# Patient Record
Sex: Female | Born: 1950 | Race: White | Hispanic: No | Marital: Married | Smoking: Never smoker
Health system: Southern US, Community
[De-identification: ages and names within clinical notes are randomized; demographics above are authoritative.]

## PROBLEM LIST (undated history)

## (undated) DIAGNOSIS — E079 Disorder of thyroid, unspecified: Secondary | ICD-10-CM

---

## 2003-09-19 ENCOUNTER — Other Ambulatory Visit: Admission: RE | Admit: 2003-09-19 | Discharge: 2003-09-19 | Payer: Self-pay | Admitting: Obstetrics and Gynecology

## 2004-02-24 ENCOUNTER — Ambulatory Visit (HOSPITAL_BASED_OUTPATIENT_CLINIC_OR_DEPARTMENT_OTHER): Admission: RE | Admit: 2004-02-24 | Discharge: 2004-02-24 | Payer: Self-pay | Admitting: Urology

## 2004-11-20 ENCOUNTER — Ambulatory Visit (HOSPITAL_COMMUNITY): Admission: RE | Admit: 2004-11-20 | Discharge: 2004-11-20 | Payer: Self-pay | Admitting: Obstetrics and Gynecology

## 2004-11-28 ENCOUNTER — Encounter: Admission: RE | Admit: 2004-11-28 | Discharge: 2004-11-28 | Payer: Self-pay | Admitting: Obstetrics and Gynecology

## 2004-12-04 ENCOUNTER — Encounter: Admission: RE | Admit: 2004-12-04 | Discharge: 2004-12-04 | Payer: Self-pay | Admitting: Obstetrics and Gynecology

## 2004-12-14 ENCOUNTER — Ambulatory Visit (HOSPITAL_COMMUNITY): Admission: RE | Admit: 2004-12-14 | Discharge: 2004-12-14 | Payer: Self-pay | Admitting: Urology

## 2004-12-14 ENCOUNTER — Ambulatory Visit (HOSPITAL_BASED_OUTPATIENT_CLINIC_OR_DEPARTMENT_OTHER): Admission: RE | Admit: 2004-12-14 | Discharge: 2004-12-14 | Payer: Self-pay | Admitting: Urology

## 2005-01-18 ENCOUNTER — Ambulatory Visit (HOSPITAL_COMMUNITY): Admission: RE | Admit: 2005-01-18 | Discharge: 2005-01-18 | Payer: Self-pay | Admitting: Gastroenterology

## 2006-04-08 ENCOUNTER — Emergency Department (HOSPITAL_COMMUNITY): Admission: EM | Admit: 2006-04-08 | Discharge: 2006-04-08 | Payer: Self-pay | Admitting: Emergency Medicine

## 2010-05-06 ENCOUNTER — Encounter: Payer: Self-pay | Admitting: Obstetrics and Gynecology

## 2017-05-30 ENCOUNTER — Other Ambulatory Visit: Payer: Self-pay

## 2017-05-30 ENCOUNTER — Emergency Department: Payer: Commercial Managed Care - PPO

## 2017-05-30 ENCOUNTER — Encounter: Payer: Self-pay | Admitting: Emergency Medicine

## 2017-05-30 ENCOUNTER — Emergency Department
Admission: EM | Admit: 2017-05-30 | Discharge: 2017-05-30 | Disposition: A | Discharge status: Home / Self Care | Payer: Commercial Managed Care - PPO | Attending: Emergency Medicine | Admitting: Emergency Medicine

## 2017-05-30 DIAGNOSIS — S42435A Nondisplaced fracture (avulsion) of lateral epicondyle of left humerus, initial encounter for closed fracture: Secondary | ICD-10-CM | POA: Diagnosis not present

## 2017-05-30 DIAGNOSIS — Y999 Unspecified external cause status: Secondary | ICD-10-CM | POA: Insufficient documentation

## 2017-05-30 DIAGNOSIS — Y939 Activity, unspecified: Secondary | ICD-10-CM | POA: Diagnosis not present

## 2017-05-30 DIAGNOSIS — W19XXXA Unspecified fall, initial encounter: Secondary | ICD-10-CM | POA: Diagnosis not present

## 2017-05-30 DIAGNOSIS — Z79899 Other long term (current) drug therapy: Secondary | ICD-10-CM | POA: Diagnosis not present

## 2017-05-30 DIAGNOSIS — S59902A Unspecified injury of left elbow, initial encounter: Secondary | ICD-10-CM | POA: Diagnosis present

## 2017-05-30 DIAGNOSIS — Y929 Unspecified place or not applicable: Secondary | ICD-10-CM | POA: Insufficient documentation

## 2017-05-30 HISTORY — DX: Disorder of thyroid, unspecified: E07.9

## 2017-05-30 MED ORDER — HYDROCODONE-ACETAMINOPHEN 5-325 MG PO TABS: 1.0000 | ORAL_TABLET | Freq: Four times a day (QID) | ORAL | 0 refills | Status: AC | PRN

## 2017-05-30 MED ORDER — HYDROCODONE-ACETAMINOPHEN 5-325 MG PO TABS
1.0000 | ORAL_TABLET | Freq: Once | ORAL | Status: AC
  Administered 2017-05-30: 1 via ORAL
  Filled 2017-05-30: qty 1

## 2017-05-30 NOTE — ED Triage Notes (Signed)
States she fell   Landed on left elbow   Increased pain with movement  Good pulses

## 2017-05-30 NOTE — Discharge Instructions (Signed)
Call make an appointment with Dr. Martha ClanKrasinski on Monday.  His information is listed on your discharge papers.  Ice and elevation to your elbow.  Wear splint until seen by the orthopedist.  Sling is for added support.  Norco as needed for pain every 6 hours.  You may also take ibuprofen as needed.  Do not drive while taking the pain medication.

## 2017-05-30 NOTE — ED Provider Notes (Signed)
[facility] Emergency Department Provider Note  ____________________________________________   First MD Initiated Contact with Patient 05/30/17 1416     (approximate)  I have reviewed the triage vital signs and the nursing notes.   HISTORY  Chief Complaint Fall and Arm Pain   HPI Nancy Gross is a 67 y.o. female is here complaint of left elbow pain.  Patient states that she fell landing on her left elbow.  Patient has had swelling and increased pain since her fall at approximately 12 noon.  Patient denies any head injury or loss of consciousness.  She rates her pain is 7/10.   Past Medical History:  Diagnosis Date  . Thyroid disease     There are no active problems to display for this patient.   History reviewed. No pertinent surgical history.  Prior to Admission medications   Medication Sig Start Date End Date Taking? Authorizing Provider  progesterone (PROMETRIUM) 100 MG capsule Take 100 mg by mouth daily.   Yes [provider]  thyroid (ARMOUR) 120 MG tablet Take by mouth daily before breakfast.   Yes [provider]  HYDROcodone-acetaminophen (NORCO/VICODIN) 5-325 MG tablet Take 1 tablet by mouth every 6 (six) hours as needed for moderate pain. 05/30/17   Tommi RumpsSummers, Jakyiah Briones L, PA-C    Allergies Patient has no known allergies.  No family history on file.  Social History Social History   Tobacco Use  . Smoking status: Never Smoker  . Smokeless tobacco: Never Used  Substance Use Topics  . Alcohol use: Yes    Comment: rarely  . Drug use: Not on file    Review of Systems Constitutional: No fever/chills Eyes: No visual changes. ENT: No trauma Cardiovascular: Denies chest pain. Respiratory: Denies shortness of breath. Gastrointestinal:   Positive nausea, no vomiting. Musculoskeletal: Positive left elbow pain. Skin: Negative for rash. Neurological: Negative for headaches, focal weakness or  numbness. ____________________________________________   PHYSICAL EXAM:  VITAL SIGNS: ED Triage Vitals  Enc Vitals Group     BP 05/30/17 1407 137/65     Pulse Rate 05/30/17 1407 91     Resp 05/30/17 1407 20     Temp 05/30/17 1407 98.1 F (36.7 C)     Temp Source 05/30/17 1407 Oral     SpO2 05/30/17 1407 97 %     Weight 05/30/17 1408 150 lb (68 kg)     Height 05/30/17 1408 5\' 3"  (1.6 m)     Head Circumference --      Peak Flow --      Pain Score 05/30/17 1408 7     Pain Loc --      Pain Edu? --      Excl. in GC? --    Constitutional: Alert and oriented. Well appearing and in no acute distress. Eyes: Conjunctivae are normal.  Head: Atraumatic. Neck: No stridor.   Cardiovascular: Normal rate, regular rhythm. Grossly normal heart sounds.  Good peripheral circulation. Respiratory: Normal respiratory effort.  No retractions. Lungs CTAB. Musculoskeletal: Examination of left elbow there is no gross deformity however there is moderate soft tissue swelling and tenderness on palpation of the lateral epicondyle area.  Skin is intact.  Range of motion is restricted secondary to pain.  Motor sensory function intact distal to the injury.  Skin is intact. Neurologic:  Normal speech and language. No gross focal neurologic deficits are appreciated.  Skin:  Skin is warm, dry and intact.  No ecchymosis or abrasions seen. Psychiatric: Mood and affect  are normal. Speech and behavior are normal.  ____________________________________________   LABS (all labs ordered are listed, but only abnormal results are displayed)  Labs Reviewed - No data to display ____________________________________________   RADIOLOGY  ED MD interpretation:   Left elbow x-ray questionable fracture distal humerus.   Official radiology report(s): Dg Elbow Complete Left  Result Date: 05/30/2017 CLINICAL DATA:  Fall. Pain swelling and bruising to lateral left elbow. EXAM: LEFT ELBOW - COMPLETE 3+ VIEW COMPARISON:   None. FINDINGS: No gross fracture or dislocation. The anterior fat pad elevation is associated with visible posterior fat pad, findings indicating joint effusion. Apparent cortical avulsion identified along the lateral epicondyle of the distal humerus. Overlying edema noted within the soft tissues. Lateral film shows superimposition of the coronoid process given slight oblique positioning. IMPRESSION: Apparent avulsion fracture involving the lateral epicondyle of the distal humerus with soft tissue edema and associated joint effusion. Coronoid process of the proximal ulna not well evaluated given slight obliquity on the lateral film. Attention to this region on follow-up imaging recommended. If patient has signs/symptoms referable to the region of the coronoid process, repeat radiographs may be warranted. Electronically Signed   By: Kennith Center M.D.   On: 05/30/2017 14:41    ____________________________________________   PROCEDURES  Procedure(s) performed:   .Splint Application Date/Time: 05/30/2017 3:48 PM Performed by: McLamb, Jerrel Ivory, RN Authorized by: Tommi Rumps, PA-C   Consent:    Consent obtained:  Verbal   Consent given by:  Patient   Risks discussed:  Swelling and pain Pre-procedure details:    Sensation:  Normal Procedure details:    Laterality:  Left   Location:  Elbow   Elbow:  L elbow   Splint type:  Long arm   Supplies:  Ortho-Glass and sling Post-procedure details:    Pain:  Unchanged   Sensation:  Normal   Patient tolerance of procedure:  Tolerated well, no immediate complications    Critical Care performed: No  ____________________________________________   INITIAL IMPRESSION / ASSESSMENT AND PLAN / ED COURSE Patient was given Norco in the emergency department.  She is made aware that she does have a fracture of her left elbow will need to follow-up with orthopedist who was Dr. Martha Clan.  Patient will call on Monday for an appointment.  She is aware  that she needs to leave the splint on until seen by the orthopedist.  Ice and elevation.  She was placed in a sling.  Patient was discharged with prescription for Norco 1 every 6 hours as needed for pain. ____________________________________________   FINAL CLINICAL IMPRESSION(S) / ED DIAGNOSES  Final diagnoses:  Closed nondisplaced avulsion fracture of lateral epicondyle of left humerus, initial encounter     ED Discharge Orders        Ordered    HYDROcodone-acetaminophen (NORCO/VICODIN) 5-325 MG tablet  Every 6 hours PRN     05/30/17 1514       Note:  This document was prepared using Dragon voice recognition software and may include unintentional dictation errors.    Tommi Rumps, PA-C 05/30/17 1550    Schaevitz, Myra Rude, MD 06/03/17 [phone]

## 2017-06-06 ENCOUNTER — Other Ambulatory Visit: Payer: Self-pay | Admitting: Orthopedic Surgery

## 2017-06-06 DIAGNOSIS — S52042A Displaced fracture of coronoid process of left ulna, initial encounter for closed fracture: Secondary | ICD-10-CM

## 2017-06-09 ENCOUNTER — Ambulatory Visit
Admission: RE | Admit: 2017-06-09 | Discharge: 2017-06-09 | Disposition: A | Discharge status: Home / Self Care | Payer: Commercial Managed Care - PPO | Source: Ambulatory Visit | Attending: Orthopedic Surgery | Admitting: Orthopedic Surgery

## 2017-06-09 DIAGNOSIS — X58XXXA Exposure to other specified factors, initial encounter: Secondary | ICD-10-CM | POA: Insufficient documentation

## 2017-06-09 DIAGNOSIS — S52042A Displaced fracture of coronoid process of left ulna, initial encounter for closed fracture: Secondary | ICD-10-CM | POA: Diagnosis present

## 2017-06-09 DIAGNOSIS — S42452A Displaced fracture of lateral condyle of left humerus, initial encounter for closed fracture: Secondary | ICD-10-CM | POA: Insufficient documentation

## 2018-07-11 IMAGING — CT CT ELBOW*L* W/O CM
3 of 4 series · 12 of 35 positions shown, 14 images · non-contrast
Comparison: Plain films left elbow 05/30/2017.

CLINICAL DATA: The patient suffered a left elbow fracture in a fall
05/30/2017. Subsequent encounter.

EXAM:
CT OF THE UPPER LEFT EXTREMITY WITHOUT CONTRAST
TECHNIQUE: Multidetector CT imaging of the upper left extremity was performed
according to the standard protocol.

[Series 4: axial bone · axial · 0.32mm/px · z∈[+257,+413]mm · 4 of 249 slices shown, 5 images]
[im 42/249  soft-tissue]
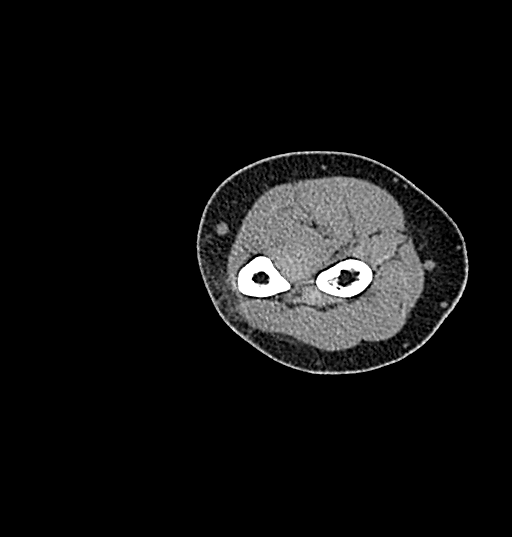
[im 42/249  bone]
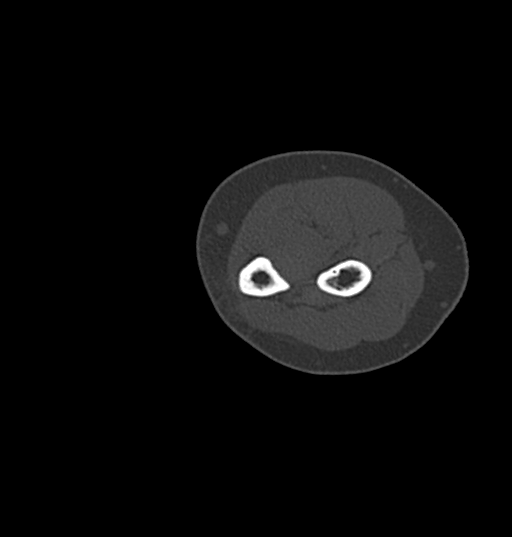
[im 83/249  bone]
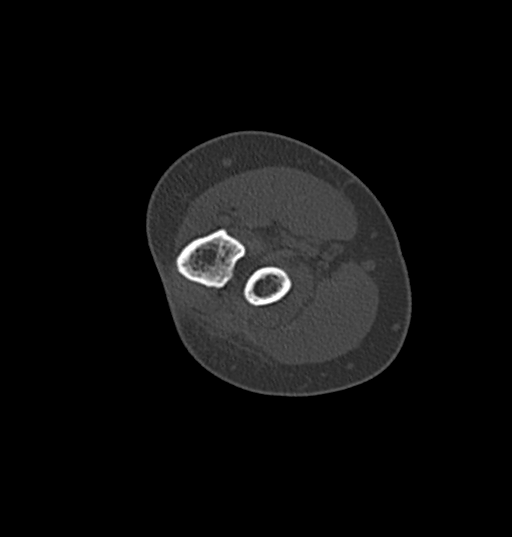
[im 166/249  bone]
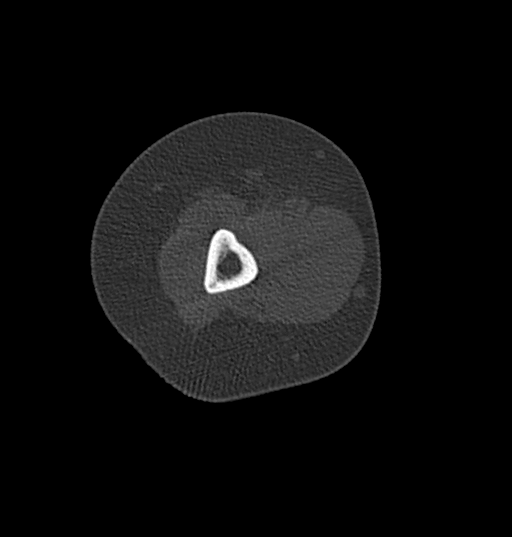
[im 207/249  bone]
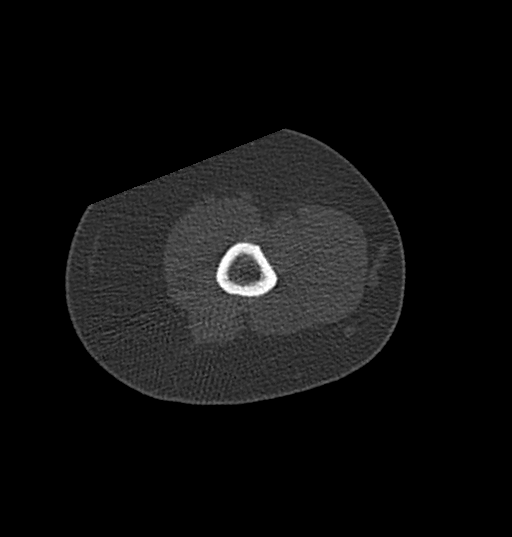

[Series 8: cor st · sagittal · 0.34mm/px · 5 of 111 slices shown, 6 images]
[im 37/111  bone]
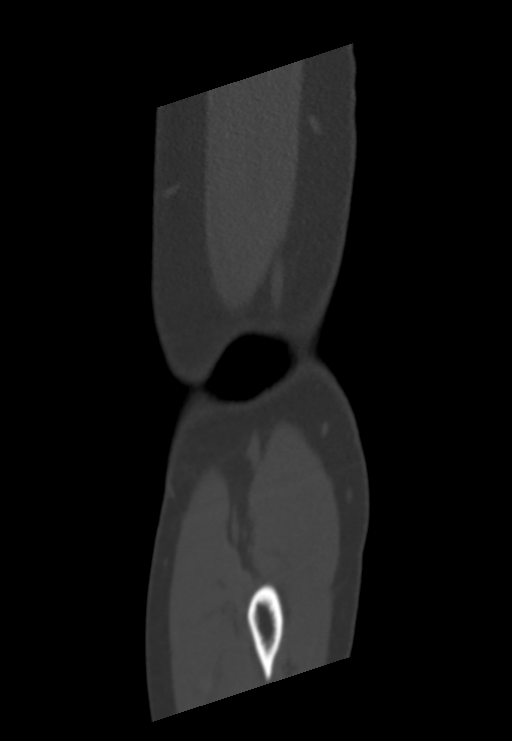
[im 46/111  bone]
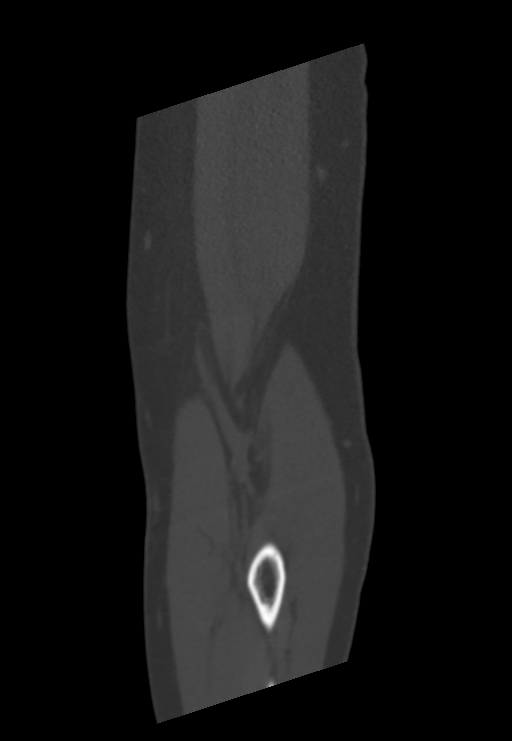
[im 56/111  soft-tissue]
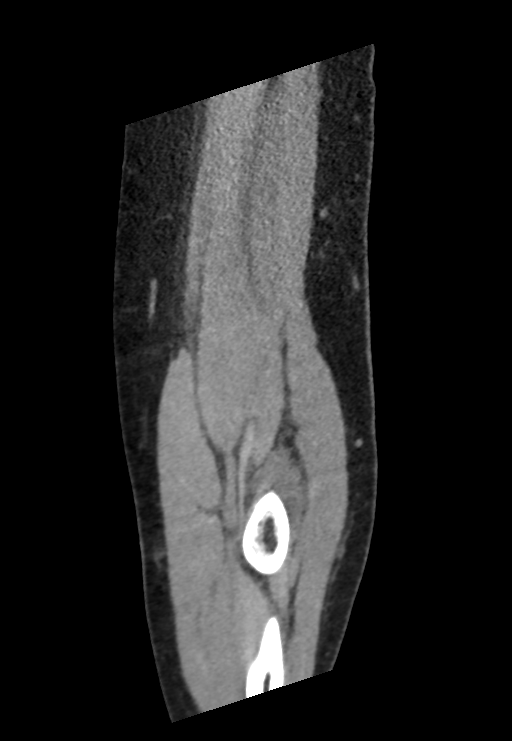
[im 56/111  bone]
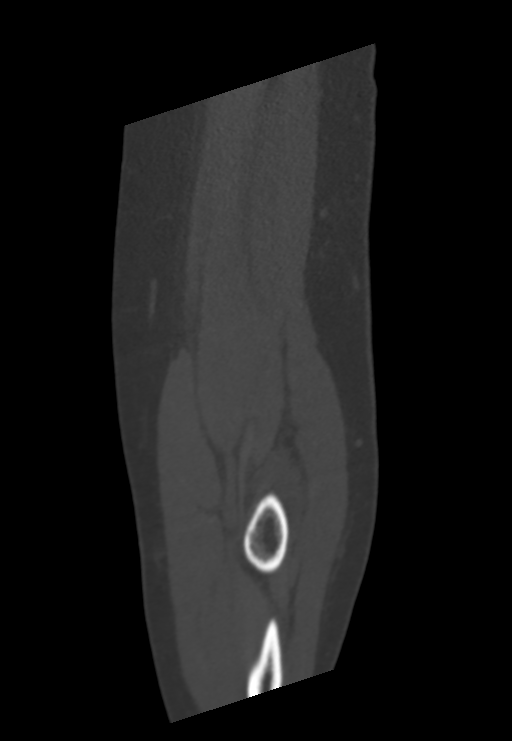
[im 65/111  bone]
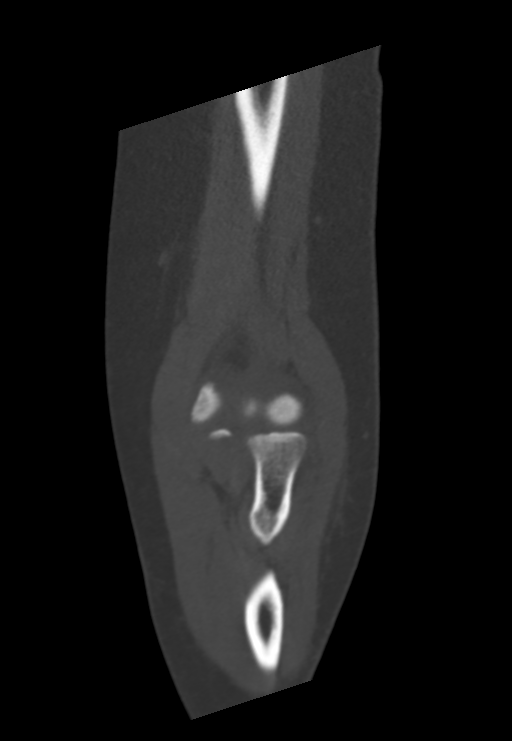
[im 74/111  bone]
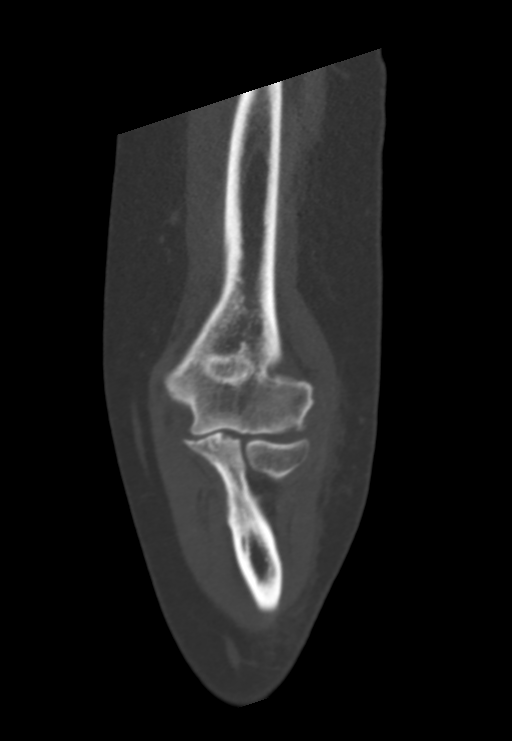

[Series 9: sag st · coronal · 0.32mm/px · 3 of 93 slices shown]
[im 19/93  bone]
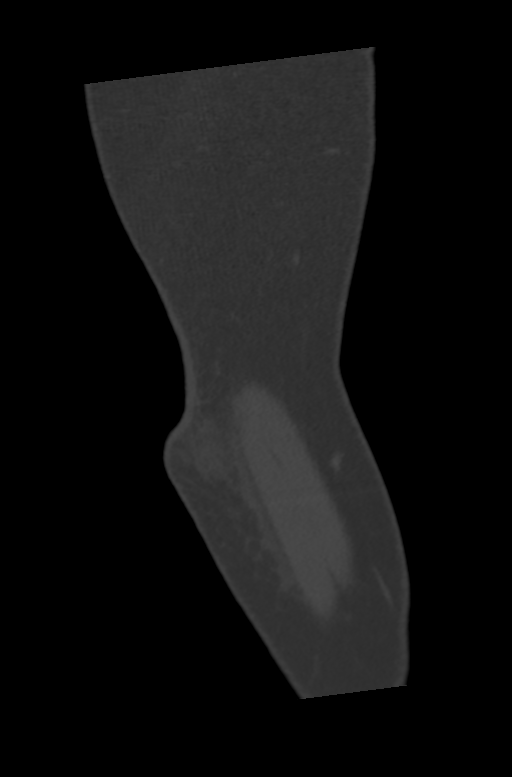
[im 37/93  bone]
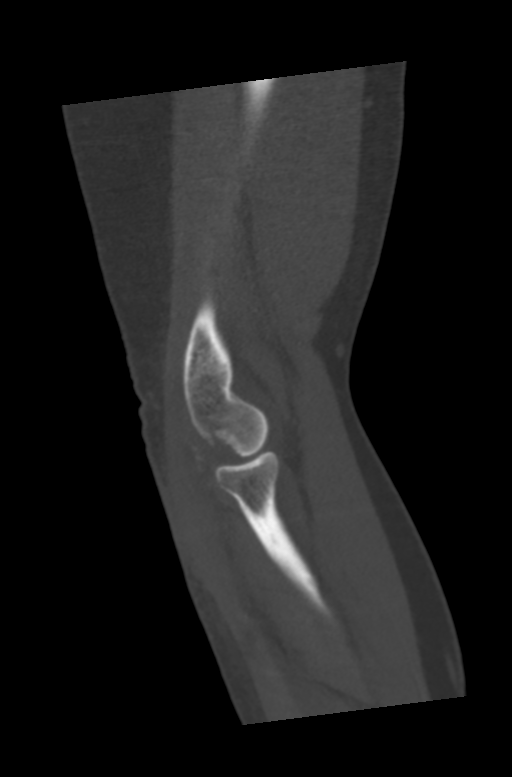
[im 56/93  bone]
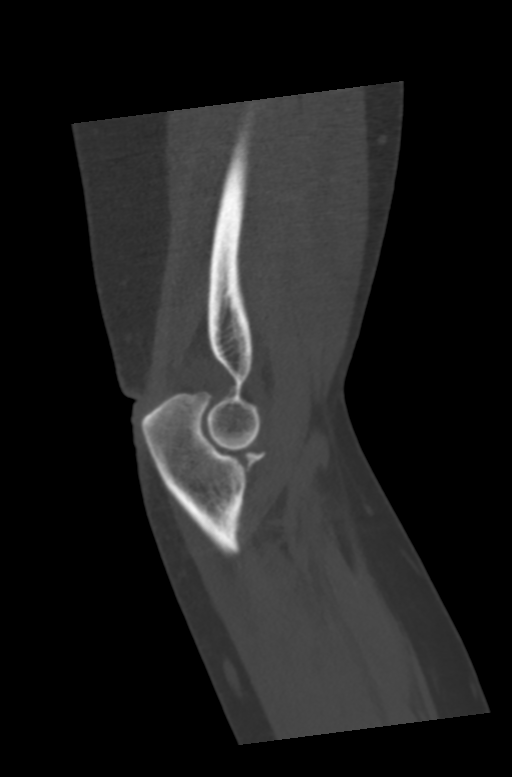

[12 of 35 positions shown; findings below may reference images not displayed]

FINDINGS: Bones/Joint/Cartilage

The patient has a fracture of the coronoid process of the ulna. The
fracture fragment measures up to 0.6 cm AP by 0.6 cm craniocaudal by
1.2 cm transverse. The fracture demonstrates 1-2 mm anterior
displacement.

The patient also has a comminuted fracture of the posterior aspect
of the capitellum. Innumerable tiny bone fragments are identified.
Largest single fragment is approximately 0.6 cm transverse. Area of
involvement measures approximately 1 cm AP and its lateral margin by
1.1 cm transverse at its posterior margin. Fractured area is
triangular in shape. Small bone fragments are displaced posteriorly
and distally approximately 1-1.5 cm.

Except as described above, no fracture is identified. The elbow is
located.

Ligaments

Suboptimally assessed by CT.

Muscles and Tendons

Appear intact.

Soft tissues

Elbow joint effusion noted.
IMPRESSION: Comminuted fracture of the posterior and lateral aspect of the
capitellum of the humerus with multiple small fracture fragments
posteriorly and inferiorly displaced up to approximately 1-1.5 cm.

Coronoid process fracture as described above.

## 2021-10-02 ENCOUNTER — Ambulatory Visit: Payer: Self-pay

## 2021-10-02 ENCOUNTER — Ambulatory Visit (INDEPENDENT_AMBULATORY_CARE_PROVIDER_SITE_OTHER): Payer: Medicare Other | Admitting: Sports Medicine

## 2021-10-02 VITALS — BP 132/82 | Ht 63.0 in | Wt 150.0 lb

## 2021-10-02 DIAGNOSIS — M25561 Pain in right knee: Secondary | ICD-10-CM

## 2021-10-02 NOTE — Progress Notes (Signed)
    SUBJECTIVE:   CHIEF COMPLAINT / HPI:   Right knee pain Patient reports that she has been having right knee pain for approximately 6 months.  She reports that she started a house renovation in January of this year and has been doing a lot of work on the house where she has to bend, kneel, be on her knees scrubbing.  She reports that over the past few months the pain has worsened and she even had 1 occurrence where she stepped on some uneven pavement and yelled because the pain was so severe.  She has been using topical magnesium as well as ligament salts to help with the pain.  She has taken occasional Aleve.  She has been wearing a compression stocking for her right knee with some relief.  She is also been doing flexion and extension exercises.  She is also occasionally iced the knee.  Denies any sudden trauma to the knee.  Has never had any scans of her knee completed.  Is here for evaluation because she wants to make sure there is no surgical need and would like to do anything she can to prevent needing any procedures.   OBJECTIVE:   BP 132/82 (BP Location: Left Arm, Patient Position: Sitting, Cuff Size: Normal)   Ht 5\' 3"  (1.6 m)   Wt 150 lb (68 kg)   BMI 26.57 kg/m   Neuro: Pleasant 71 year old female in no acute distress Respiratory: Normal for breathing, speaking full sentences Right knee exam: No gross deformity, ecchymoses.  Mild to moderate amount of diffuse swelling Tenderness to palpation along medial joint line FROM.  Occasional crepitus appreciated with no locking Negative ant/post drawers. Negative valgus/varus testing. Negative lachmans. Negative mcmurrays, apleys, patellar apprehension. NV intact distally.  Ultrasound of Right Knee Small effusion in Right SPP not noted on the left Medial joint line shows some hypoechoic change over medial meniscus with no evidence of menisical tear on 78 Lateral joint line unremarkable Patellar and quad tendons normal  Impression:  Small effusion and probable meniscal contusion of right knee  Ultrasound and interpretation by [place]. Fields, MD   ASSESSMENT/PLAN:   Right knee pain Patient with 70-month history of right knee pain.  Physical exam concerning for meniscal injury.  Ultrasound showing no meniscal tear although there is effusion consistent with irritation/inflammation of the meniscus.  See official ultrasound read above.  Discussed strengthening exercises for the knee as well as ice, heat, topical medications, pain medications.  Discussed use of a sleeve.  Plan on follow-up in 1 month if improvement has not occurred.     8-month, MD  [facility]   I observed and examined the patient with the resident and agree with assessment and plan.  Note reviewed and modified by me. [facility], MD

## 2021-10-02 NOTE — Assessment & Plan Note (Signed)
Patient with 42-month history of right knee pain.  Physical exam concerning for meniscal injury.  Ultrasound showing no meniscal tear although there is effusion consistent with irritation/inflammation of the meniscus.  See official ultrasound read above.  Discussed strengthening exercises for the knee as well as ice, heat, topical medications, pain medications.  Discussed use of a sleeve.  Plan on follow-up in 1 month if improvement has not occurred.

## 2022-05-27 DIAGNOSIS — M2550 Pain in unspecified joint: Secondary | ICD-10-CM | POA: Diagnosis not present

## 2022-05-27 DIAGNOSIS — F988 Other specified behavioral and emotional disorders with onset usually occurring in childhood and adolescence: Secondary | ICD-10-CM | POA: Diagnosis not present

## 2022-05-27 DIAGNOSIS — Z77120 Contact with and (suspected) exposure to mold (toxic): Secondary | ICD-10-CM | POA: Diagnosis not present

## 2022-05-27 DIAGNOSIS — R5382 Chronic fatigue, unspecified: Secondary | ICD-10-CM | POA: Diagnosis not present

## 2022-05-27 DIAGNOSIS — R799 Abnormal finding of blood chemistry, unspecified: Secondary | ICD-10-CM | POA: Diagnosis not present

## 2022-05-27 DIAGNOSIS — E039 Hypothyroidism, unspecified: Secondary | ICD-10-CM | POA: Diagnosis not present

## 2022-05-27 DIAGNOSIS — Z780 Asymptomatic menopausal state: Secondary | ICD-10-CM | POA: Diagnosis not present

## 2022-08-30 DIAGNOSIS — H2513 Age-related nuclear cataract, bilateral: Secondary | ICD-10-CM | POA: Diagnosis not present

## 2022-10-02 DIAGNOSIS — Z0100 Encounter for examination of eyes and vision without abnormal findings: Secondary | ICD-10-CM | POA: Diagnosis not present

## 2022-12-10 DIAGNOSIS — R799 Abnormal finding of blood chemistry, unspecified: Secondary | ICD-10-CM | POA: Diagnosis not present

## 2022-12-10 DIAGNOSIS — Z780 Asymptomatic menopausal state: Secondary | ICD-10-CM | POA: Diagnosis not present

## 2022-12-10 DIAGNOSIS — Z77120 Contact with and (suspected) exposure to mold (toxic): Secondary | ICD-10-CM | POA: Diagnosis not present

## 2022-12-10 DIAGNOSIS — F988 Other specified behavioral and emotional disorders with onset usually occurring in childhood and adolescence: Secondary | ICD-10-CM | POA: Diagnosis not present

## 2022-12-10 DIAGNOSIS — E039 Hypothyroidism, unspecified: Secondary | ICD-10-CM | POA: Diagnosis not present

## 2022-12-10 DIAGNOSIS — M2550 Pain in unspecified joint: Secondary | ICD-10-CM | POA: Diagnosis not present

## 2022-12-10 DIAGNOSIS — R5382 Chronic fatigue, unspecified: Secondary | ICD-10-CM | POA: Diagnosis not present

## 2023-02-05 DIAGNOSIS — E039 Hypothyroidism, unspecified: Secondary | ICD-10-CM | POA: Diagnosis not present

## 2023-02-10 ENCOUNTER — Ambulatory Visit
Admission: EM | Admit: 2023-02-10 | Discharge: 2023-02-10 | Disposition: A | Discharge status: Home / Self Care | Payer: Medicare HMO | Attending: Internal Medicine | Admitting: Internal Medicine

## 2023-02-10 ENCOUNTER — Other Ambulatory Visit: Payer: Self-pay

## 2023-02-10 DIAGNOSIS — B028 Zoster with other complications: Secondary | ICD-10-CM

## 2023-02-10 MED ORDER — VALACYCLOVIR HCL 1 G PO TABS: 1000.0000 mg | ORAL_TABLET | Freq: Three times a day (TID) | ORAL | 0 refills | Status: AC

## 2023-02-10 MED ORDER — PREDNISONE 20 MG PO TABS: 40.0000 mg | ORAL_TABLET | Freq: Every day | ORAL | 0 refills | Status: AC

## 2023-02-10 NOTE — ED Triage Notes (Signed)
Patient presents with "sores breakout on back of neck, lump behind left ear and front of left ear. Shooting pain that started today in left ear." This all started on Thursday. Treated with hot compress.

## 2023-02-10 NOTE — Discharge Instructions (Signed)
It appears that you have shingles.  I have prescribed you Valtrex and prednisone.  Referral to ear, nose, throat specialist has also been placed given it is overlying your ear.  If they do not call you in the next few days, please call them yourself at provided phone number.  Follow-up if symptoms persist or worsen.

## 2023-02-10 NOTE — ED Provider Notes (Signed)
[facility]    CSN: [id number] Arrival date & time: 02/10/23  1700      History   Chief Complaint Chief Complaint  Patient presents with   Otalgia    HPI Nancy Gross is a 71 y.o. female.   Patient presents with rash that extends around her neck and overlies her ear that started about 4 to 5 days ago.  Reports it was previously painful but is now just "uncomfortable".  Denies any itchiness.  Denies any changes to the environment that could have caused rash.  Denies any known sick contacts or fever.  Reports that she applied hydrocortisone cream with minimal improvement. Patient is not reporting any hearing loss.    Otalgia   Past Medical History:  Diagnosis Date   Thyroid disease     Patient Active Problem List   Diagnosis Date Noted   Right knee pain 10/02/2021    History reviewed. No pertinent surgical history.  OB History   No obstetric history on file.      Home Medications    Prior to Admission medications   Medication Sig Start Date End Date Taking? Authorizing Provider  predniSONE (DELTASONE) 20 MG tablet Take 2 tablets (40 mg total) by mouth daily for 5 days. 02/10/23 02/15/23 Yes Shayley Medlin, Acie Fredrickson, FNP  valACYclovir (VALTREX) 1000 MG tablet Take 1 tablet (1,000 mg total) by mouth 3 (three) times daily for 10 days. 02/10/23 02/20/23 Yes Aarion Metzgar, Acie Fredrickson, FNP  HYDROcodone-acetaminophen (NORCO/VICODIN) 5-325 MG tablet Take 1 tablet by mouth every 6 (six) hours as needed for moderate pain. 05/30/17   Tommi Rumps, PA-C  progesterone (PROMETRIUM) 100 MG capsule Take 100 mg by mouth daily.    [provider]  thyroid (ARMOUR) 120 MG tablet Take by mouth daily before breakfast.    [provider]    Family History No family history on file.  Social History Social History   Tobacco Use   Smoking status: Never   Smokeless tobacco: Never  Substance Use Topics   Alcohol use: Yes    Comment: 1 beer a week   Drug use: Never      Allergies   Patient has no known allergies.   Review of Systems Review of Systems Per HPI  Physical Exam Triage Vital Signs ED Triage Vitals  Encounter Vitals Group     BP 02/10/23 1725 130/81     Systolic BP Percentile --      Diastolic BP Percentile --      Pulse Rate 02/10/23 1725 70     Resp --      Temp 02/10/23 1725 98 F (36.7 C)     Temp Source 02/10/23 1725 Oral     SpO2 02/10/23 1725 100 %     Weight 02/10/23 1723 152 lb (68.9 kg)     Height 02/10/23 1723 5\' 3"  (1.6 m)     Head Circumference --      Peak Flow --      Pain Score 02/10/23 1723 4     Pain Loc --      Pain Education --      Exclude from Growth Chart --    No data found.  Updated Vital Signs BP 130/81 (BP Location: Left Arm)   Pulse 70   Temp 98 F (36.7 C) (Oral)   Ht 5\' 3"  (1.6 m)   Wt 152 lb (68.9 kg)   SpO2 100%   BMI 26.93 kg/m   Visual Acuity  Right Eye Distance:   Left Eye Distance:   Bilateral Distance:    Right Eye Near:   Left Eye Near:    Bilateral Near:     Physical Exam Constitutional:      General: She is not in acute distress.    Appearance: Normal appearance. She is not toxic-appearing or diaphoretic.  HENT:     Head: Normocephalic and atraumatic.     Left Ear: Tympanic membrane and ear canal normal.     Ears:     Comments: Inner ear appears normal. Eyes:     Extraocular Movements: Extraocular movements intact.     Conjunctiva/sclera: Conjunctivae normal.  Neck:     Comments: Patient has erythematous, mildly vesicular lesions that started at the left posterior neck and wrap around to the left anterior neck.  There is also some redness with very mild swelling overlying the outer ear. Pulmonary:     Effort: Pulmonary effort is normal.  Neurological:     General: No focal deficit present.     Mental Status: She is alert and oriented to person, place, and time. Mental status is at baseline.  Psychiatric:        Mood and Affect: Mood normal.         Behavior: Behavior normal.        Thought Content: Thought content normal.        Judgment: Judgment normal.      UC Treatments / Results  Labs (all labs ordered are listed, but only abnormal results are displayed) Labs Reviewed - No data to display  EKG   Radiology No results found.  Procedures Procedures (including critical care time)  Medications Ordered in UC Medications - No data to display  Initial Impression / Assessment and Plan / UC Course  I have reviewed the triage vital signs and the nursing notes.  Pertinent labs & imaging results that were available during my care of the patient were reviewed by me and considered in my medical decision making (see chart for details).     Rash appears suspicious for herpes zoster.  Will treat with Valtrex.  I am concerned given rash is overlying ear but patient is currently denying any hearing loss and inner ear appears normal.  Although, will treat with prednisone steroid burst and ambulatory referral to ENT was placed today.  Advised patient that if they do not call her in the next few days, she is to call them herself at provided phone number.  Also advised strict return and ER precautions.  Patient verbalized understanding and was agreeable with plan. Final Clinical Impressions(s) / UC Diagnoses   Final diagnoses:  Herpes zoster with other complication     Discharge Instructions      It appears that you have shingles.  I have prescribed you Valtrex and prednisone.  Referral to ear, nose, throat specialist has also been placed given it is overlying your ear.  If they do not call you in the next few days, please call them yourself at provided phone number.  Follow-up if symptoms persist or worsen.    ED Prescriptions     Medication Sig Dispense Auth. Provider   valACYclovir (VALTREX) 1000 MG tablet Take 1 tablet (1,000 mg total) by mouth 3 (three) times daily for 10 days. 30 tablet [place], [place], [place]   predniSONE  (DELTASONE) 20 MG tablet Take 2 tablets (40 mg total) by mouth daily for 5 days. 10 tablet [place], Acie Fredrickson, [place]  PDMP not reviewed this encounter.   Gustavus Bryant, [place] 02/10/23 [phone]

## 2023-06-24 DIAGNOSIS — R5382 Chronic fatigue, unspecified: Secondary | ICD-10-CM | POA: Diagnosis not present

## 2023-06-24 DIAGNOSIS — E039 Hypothyroidism, unspecified: Secondary | ICD-10-CM | POA: Diagnosis not present

## 2023-06-24 DIAGNOSIS — Z77120 Contact with and (suspected) exposure to mold (toxic): Secondary | ICD-10-CM | POA: Diagnosis not present

## 2023-06-24 DIAGNOSIS — Z780 Asymptomatic menopausal state: Secondary | ICD-10-CM | POA: Diagnosis not present

## 2023-06-24 DIAGNOSIS — R799 Abnormal finding of blood chemistry, unspecified: Secondary | ICD-10-CM | POA: Diagnosis not present

## 2023-06-24 DIAGNOSIS — F988 Other specified behavioral and emotional disorders with onset usually occurring in childhood and adolescence: Secondary | ICD-10-CM | POA: Diagnosis not present

## 2023-06-24 DIAGNOSIS — M2550 Pain in unspecified joint: Secondary | ICD-10-CM | POA: Diagnosis not present

## 2023-08-18 DIAGNOSIS — H2513 Age-related nuclear cataract, bilateral: Secondary | ICD-10-CM | POA: Diagnosis not present

## 2023-09-29 DIAGNOSIS — Z0100 Encounter for examination of eyes and vision without abnormal findings: Secondary | ICD-10-CM | POA: Diagnosis not present

## 2023-10-30 DIAGNOSIS — L821 Other seborrheic keratosis: Secondary | ICD-10-CM | POA: Diagnosis not present

## 2023-10-30 DIAGNOSIS — D485 Neoplasm of uncertain behavior of skin: Secondary | ICD-10-CM | POA: Diagnosis not present

## 2023-10-30 DIAGNOSIS — D2371 Other benign neoplasm of skin of right lower limb, including hip: Secondary | ICD-10-CM | POA: Diagnosis not present

## 2023-10-30 DIAGNOSIS — C44722 Squamous cell carcinoma of skin of right lower limb, including hip: Secondary | ICD-10-CM | POA: Diagnosis not present

## 2023-12-17 DIAGNOSIS — C44722 Squamous cell carcinoma of skin of right lower limb, including hip: Secondary | ICD-10-CM | POA: Diagnosis not present

## 2023-12-17 DIAGNOSIS — L905 Scar conditions and fibrosis of skin: Secondary | ICD-10-CM | POA: Diagnosis not present
